# Patient Record
Sex: Female | Born: 1980 | Race: White | Hispanic: No | Marital: Married | Smoking: Never smoker
Health system: Southern US, Community
[De-identification: ages and names within clinical notes are randomized; demographics above are authoritative.]

## PROBLEM LIST (undated history)

## (undated) DIAGNOSIS — F329 Major depressive disorder, single episode, unspecified: Secondary | ICD-10-CM

## (undated) DIAGNOSIS — F32A Depression, unspecified: Secondary | ICD-10-CM

## (undated) DIAGNOSIS — E079 Disorder of thyroid, unspecified: Secondary | ICD-10-CM

## (undated) DIAGNOSIS — E282 Polycystic ovarian syndrome: Secondary | ICD-10-CM

## (undated) HISTORY — PX: ABDOMINAL HYSTERECTOMY: SHX81

## (undated) HISTORY — PX: OTHER SURGICAL HISTORY: SHX169

## (undated) HISTORY — PX: CHOLECYSTECTOMY: SHX55

---

## 2015-05-07 ENCOUNTER — Encounter (HOSPITAL_COMMUNITY): Payer: Self-pay

## 2015-05-07 ENCOUNTER — Emergency Department (HOSPITAL_COMMUNITY): Payer: BC Managed Care – PPO

## 2015-05-07 ENCOUNTER — Emergency Department (HOSPITAL_COMMUNITY)
Admission: EM | Admit: 2015-05-07 | Discharge: 2015-05-07 | Disposition: A | Discharge status: Home / Self Care | Payer: BC Managed Care – PPO | Attending: Emergency Medicine | Admitting: Emergency Medicine

## 2015-05-07 DIAGNOSIS — Y9389 Activity, other specified: Secondary | ICD-10-CM | POA: Insufficient documentation

## 2015-05-07 DIAGNOSIS — S161XXA Strain of muscle, fascia and tendon at neck level, initial encounter: Secondary | ICD-10-CM

## 2015-05-07 DIAGNOSIS — Y998 Other external cause status: Secondary | ICD-10-CM | POA: Diagnosis not present

## 2015-05-07 DIAGNOSIS — E079 Disorder of thyroid, unspecified: Secondary | ICD-10-CM | POA: Insufficient documentation

## 2015-05-07 DIAGNOSIS — S199XXA Unspecified injury of neck, initial encounter: Secondary | ICD-10-CM | POA: Diagnosis present

## 2015-05-07 DIAGNOSIS — Y92410 Unspecified street and highway as the place of occurrence of the external cause: Secondary | ICD-10-CM | POA: Insufficient documentation

## 2015-05-07 DIAGNOSIS — F329 Major depressive disorder, single episode, unspecified: Secondary | ICD-10-CM | POA: Insufficient documentation

## 2015-05-07 DIAGNOSIS — S3992XA Unspecified injury of lower back, initial encounter: Secondary | ICD-10-CM | POA: Diagnosis not present

## 2015-05-07 HISTORY — DX: Disorder of thyroid, unspecified: E07.9

## 2015-05-07 HISTORY — DX: Polycystic ovarian syndrome: E28.2

## 2015-05-07 HISTORY — DX: Major depressive disorder, single episode, unspecified: F32.9

## 2015-05-07 HISTORY — DX: Depression, unspecified: F32.A

## 2015-05-07 MED ORDER — IBUPROFEN 800 MG PO TABS: 800.0000 mg | ORAL_TABLET | Freq: Three times a day (TID) | ORAL | Status: AC

## 2015-05-07 MED ORDER — LORAZEPAM 1 MG PO TABS
0.5000 mg | ORAL_TABLET | Freq: Once | ORAL | Status: AC
  Administered 2015-05-07: 0.5 mg via ORAL
  Filled 2015-05-07: qty 1

## 2015-05-07 MED ORDER — KETOROLAC TROMETHAMINE 60 MG/2ML IM SOLN
30.0000 mg | Freq: Once | INTRAMUSCULAR | Status: AC
  Administered 2015-05-07: 30 mg via INTRAMUSCULAR
  Filled 2015-05-07: qty 2

## 2015-05-07 MED ORDER — DIAZEPAM 5 MG PO TABS: 5.0000 mg | ORAL_TABLET | Freq: Three times a day (TID) | ORAL | Status: AC | PRN
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
## 2015-05-07 NOTE — ED Notes (Signed)
Patient reports pain to right shoulder, neck, and chest.

## 2015-05-07 NOTE — ED Notes (Signed)
Patient transported to X-ray 

## 2015-05-07 NOTE — ED Notes (Signed)
PA at bedside.
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
## 2016-08-25 ENCOUNTER — Encounter (HOSPITAL_BASED_OUTPATIENT_CLINIC_OR_DEPARTMENT_OTHER): Payer: Self-pay | Admitting: Emergency Medicine

## 2016-08-25 ENCOUNTER — Emergency Department (HOSPITAL_BASED_OUTPATIENT_CLINIC_OR_DEPARTMENT_OTHER): Payer: BC Managed Care – PPO

## 2016-08-25 ENCOUNTER — Emergency Department (HOSPITAL_BASED_OUTPATIENT_CLINIC_OR_DEPARTMENT_OTHER)
Admission: EM | Admit: 2016-08-25 | Discharge: 2016-08-25 | Disposition: A | Discharge status: Home / Self Care | Payer: BC Managed Care – PPO | Attending: Emergency Medicine | Admitting: Emergency Medicine

## 2016-08-25 DIAGNOSIS — R222 Localized swelling, mass and lump, trunk: Secondary | ICD-10-CM | POA: Diagnosis not present

## 2016-08-25 DIAGNOSIS — R1900 Intra-abdominal and pelvic swelling, mass and lump, unspecified site: Secondary | ICD-10-CM | POA: Diagnosis present

## 2016-08-25 DIAGNOSIS — E039 Hypothyroidism, unspecified: Secondary | ICD-10-CM | POA: Diagnosis not present

## 2016-08-25 LAB — CBC WITH DIFFERENTIAL/PLATELET
Basophils Absolute: 0 10*3/uL (ref 0.0–0.1)
Basophils Relative: 0 %
EOS PCT: 1 %
Eosinophils Absolute: 0 10*3/uL (ref 0.0–0.7)
HEMATOCRIT: 41.4 % (ref 36.0–46.0)
Hemoglobin: 14.9 g/dL (ref 12.0–15.0)
LYMPHS ABS: 2.6 10*3/uL (ref 0.7–4.0)
LYMPHS PCT: 40 %
MCH: 31.4 pg (ref 26.0–34.0)
MCHC: 36 g/dL (ref 30.0–36.0)
MCV: 87.2 fL (ref 78.0–100.0)
MONO ABS: 0.5 10*3/uL (ref 0.1–1.0)
Monocytes Relative: 8 %
NEUTROS ABS: 3.4 10*3/uL (ref 1.7–7.7)
Neutrophils Relative %: 51 %
PLATELETS: 189 10*3/uL (ref 150–400)
RBC: 4.75 MIL/uL (ref 3.87–5.11)
RDW: 12.3 % (ref 11.5–15.5)
WBC: 6.5 10*3/uL (ref 4.0–10.5)

## 2016-08-25 LAB — BASIC METABOLIC PANEL
Anion gap: 9 (ref 5–15)
BUN: 14 mg/dL (ref 6–20)
CHLORIDE: 105 mmol/L (ref 101–111)
CO2: 24 mmol/L (ref 22–32)
Calcium: 9 mg/dL (ref 8.9–10.3)
Creatinine, Ser: 0.82 mg/dL (ref 0.44–1.00)
GFR calc Af Amer: >60 mL/min (ref >60)
GFR calc non Af Amer: >60 mL/min (ref >60)
GLUCOSE: 92 mg/dL (ref 65–99)
POTASSIUM: 3.9 mmol/L (ref 3.5–5.1)
Sodium: 138 mmol/L (ref 135–145)

## 2016-08-25 MED ORDER — IOPAMIDOL (ISOVUE-300) INJECTION 61%
100.0000 mL | Freq: Once | INTRAVENOUS | Status: AC | PRN
  Administered 2016-08-25: 100 mL via INTRAVENOUS

## 2016-08-25 NOTE — ED Triage Notes (Signed)
Pt states she has a knott on the inside of her right side, sent over from urgent care

## 2016-08-25 NOTE — ED Provider Notes (Signed)
[facility] Provider Note   CSN: [id number] Arrival date & time: 08/25/16  1645  By signing my name below, I, Doreatha Martin, attest that this documentation has been prepared under the direction and in the presence of Rolan Bucco, MD. Electronically Signed: Doreatha Martin, ED Scribe. 08/25/16. 6:17 PM.   History   Chief Complaint Chief Complaint  Patient presents with  . Abdominal Pain    HPI Alicia Lewis is a 35 y.o. female with h/o cholecystectomy who presents to the Emergency Department complaining of an unchanged, intermittently painful area of swelling on her right lateral abdomen onset a week ago. Pt describes her pain as burning and reports she feels pain with palpation or when lying on her right side. Pt notes she had a few loose bowel movements today, but states that they have otherwise been normal with her current symptoms. No recent illnesses. She denies rash, nausea, emesis, fever.  She was seen at Urgent care and sent her to get a CT scan.  The history is provided by the patient. No language interpreter was used.    Past Medical History:  Diagnosis Date  . Depression   . Polycystic disease, ovaries   . Thyroid disease    Hypothyroidism    There are no active problems to display for this patient.   Past Surgical History:  Procedure Laterality Date  . ABDOMINAL HYSTERECTOMY    . CESAREAN SECTION    . CHOLECYSTECTOMY    . KNee Surgery      OB History    No data available       Home Medications    Prior to Admission medications   Medication Sig Start Date End Date Taking? Authorizing Provider  ALPRAZolam Prudy Feeler) 0.25 MG tablet Take 0.25 mg by mouth daily as needed for anxiety.  03/17/15   Historical Provider, MD  buPROPion (WELLBUTRIN) 75 MG tablet Take 75 mg by mouth daily. 04/18/15   Historical Provider, MD  diazepam (VALIUM) 5 MG tablet Take 1 tablet (5 mg total) by mouth every 8 (eight) hours as needed for muscle spasms. 05/07/15   Roxy Horseman, PA-C  estradiol (ESTRACE) 0.5 MG tablet Take 0.5 mg by mouth daily as needed (yeast infections).  02/26/15   Historical Provider, MD  fexofenadine (ALLEGRA) 180 MG tablet Take 180 mg by mouth daily as needed for allergies or rhinitis.    Historical Provider, MD  ibuprofen (ADVIL,MOTRIN) 800 MG tablet Take 1 tablet (800 mg total) by mouth 3 (three) times daily. 05/07/15   Roxy Horseman, PA-C  LEVOXYL 50 MCG tablet Take 50 mcg by mouth at bedtime. 04/18/15   Historical Provider, MD    Family History History reviewed. No pertinent family history.  Social History Social History  Substance Use Topics  . Smoking status: Never Smoker  . Smokeless tobacco: Never Used  . Alcohol use No     Allergies   Demerol [meperidine]; Vicodin [hydrocodone-acetaminophen]; Latex; and Sulfa antibiotics   Review of Systems Review of Systems  Constitutional: Negative for chills, diaphoresis, fatigue and fever.  HENT: Negative for congestion, rhinorrhea and sneezing.   Eyes: Negative.   Respiratory: Negative for cough, chest tightness and shortness of breath.   Cardiovascular: Negative for chest pain and leg swelling.  Gastrointestinal: Positive for abdominal pain. Negative for blood in stool, diarrhea, nausea and vomiting.  Genitourinary: Negative for difficulty urinating, flank pain, frequency and hematuria.  Musculoskeletal: Negative for arthralgias and back pain.  Skin: Negative for rash.  Neurological: Negative  for dizziness, speech difficulty, weakness, numbness and headaches.     Physical Exam Updated Vital Signs BP 153/90 (BP Location: Left Arm)   Pulse 88   Temp 98.6 F (37 C) (Oral)   Resp 18   Ht 5' 6.5" (1.689 m)   Wt 202 lb (91.6 kg)   SpO2 100%   BMI 32.12 kg/m   Physical Exam  Constitutional: She is oriented to person, place, and time. She appears well-developed and well-nourished.  HENT:  Head: Normocephalic and atraumatic.  Eyes: Pupils are equal, round, and  reactive to light.  Neck: Normal range of motion. Neck supple.  Cardiovascular: Normal rate, regular rhythm and normal heart sounds.   Pulmonary/Chest: Effort normal and breath sounds normal. No respiratory distress. She has no wheezes. She has no rales. She exhibits no tenderness.  Abdominal: Soft. Bowel sounds are normal. There is tenderness. There is no rebound and no guarding.  Linear palpable mass to the right mid abdomen about 2 cm x 5 cm. TTP. No surrounding tenderness. No overlying rash.   Musculoskeletal: Normal range of motion. She exhibits no edema.  Lymphadenopathy:    She has no cervical adenopathy.  Neurological: She is alert and oriented to person, place, and time.  Skin: Skin is warm and dry. No rash noted.  Psychiatric: She has a normal mood and affect.  Nursing note and vitals reviewed.    ED Treatments / Results   DIAGNOSTIC STUDIES: Oxygen Saturation is 100% on RA, normal by my interpretation.    COORDINATION OF CARE: 6:13 PM Discussed treatment plan with pt at bedside which includes CT A/P and pt agreed to plan.   Labs (all labs ordered are listed, but only abnormal results are displayed) Labs Reviewed  BASIC METABOLIC PANEL  CBC WITH DIFFERENTIAL/PLATELET    EKG  EKG Interpretation None       Radiology Ct Abdomen Pelvis W Contrast  Result Date: 08/25/2016 CLINICAL DATA:  Patient has a knot on right lateral abdominal area for about 1 week with burning sensation. Prior cholecystectomy. EXAM: CT ABDOMEN AND PELVIS WITH CONTRAST TECHNIQUE: Multidetector CT imaging of the abdomen and pelvis was performed using the standard protocol following bolus administration of intravenous contrast. CONTRAST:  ISOVUE-300 IOPAMIDOL (ISOVUE-300) INJECTION 61% COMPARISON:  None. FINDINGS: Radio-opaque marker has been placed on the skin at the site of patient concern. Lower chest: Unremarkable. Hepatobiliary: No focal abnormality within the liver parenchyma. Gallbladder  surgically absent. No intrahepatic or extrahepatic biliary dilation. Pancreas: No focal mass lesion. No dilatation of the main duct. No intraparenchymal cyst. No peripancreatic edema. Spleen: No splenomegaly. No focal mass lesion. Adrenals/Urinary Tract: No adrenal nodule or mass. Kidneys are unremarkable. No evidence for hydroureter. The urinary bladder appears normal for the degree of distention. Stomach/Bowel: Stomach is nondistended. No gastric wall thickening. No evidence of outlet obstruction. Duodenum is normally positioned as is the ligament of Treitz. No small bowel wall thickening. No small bowel dilatation. The terminal ileum is normal. The appendix is not visualized, but there is no edema or inflammation in the region of the cecum. No gross colonic mass. No colonic wall thickening. No substantial diverticular change. Vascular/Lymphatic: No abdominal aortic aneurysm. No abdominal aortic atherosclerotic calcification. There is no gastrohepatic or hepatoduodenal ligament lymphadenopathy. No intraperitoneal or retroperitoneal lymphadenopathy. No pelvic sidewall lymphadenopathy. Reproductive: Uterus surgically absent.  There is no adnexal mass. Other: No intraperitoneal free fluid. Musculoskeletal: Sclerotic focus posterior right acetabulum likely a bone island given patient age and lack of  primary cancer history. Tiny sclerotic focus posterior left iliac bone also likely bone island. Evaluating the right antral lateral abdominal wall in the region of the localization marker demonstrates no underlying mass lesion. No evidence for skin thickening or subcutaneous edema/inflammation. No anterior abdominal wall lipoma. No evidence for hernia in this region or elsewhere in the abdomen. IMPRESSION: 1. No abnormality identified in the right antero lateral abdominal wall as localized by the patient and placement of a radiopaque marker on the skin. 2. No acute findings in the abdomen or pelvis. Electronically Signed    By: Kennith CenterEric  Mansell M.D.   On: 08/25/2016 19:54    Procedures Procedures (including critical care time)  Medications Ordered in ED Medications  iopamidol (ISOVUE-300) 61 % injection 100 mL (100 mLs Intravenous Contrast Given 08/25/16 1923)     Initial Impression / Assessment and Plan / ED Course  I have reviewed the triage vital signs and the nursing notes.  Pertinent labs & imaging results that were available during my care of the patient were reviewed by me and considered in my medical decision making (see chart for details).  Clinical Course    There is no abnormal mass or other findings noted on the CT scan. No evidence of abscess. Clinically it does not look like a cellulitis or infection. Patient was advised to use warm compresses to the area. She was advised to use ibuprofen or Tylenol for symptomatic relief and to follow-up with her PCP for recheck.  Final Clinical Impressions(s) / ED Diagnoses   Final diagnoses:  Abdominal wall mass    New Prescriptions New Prescriptions   No medications on file    I personally performed the services described in this documentation, which was scribed in my presence.  The recorded information has been reviewed and considered.    Rolan BuccoMelanie Iasia Forcier, MD 08/25/16 2011

## 2016-08-25 NOTE — Discharge Instructions (Signed)
Use warm compresses to area.  Use Tylenol or ibuprofen for symptomatic relief. Follow-up with your PCP if her symptoms are not improving. Return here as needed for any worsening symptoms.

## 2017-02-12 IMAGING — CT CT ABD-PELV W/ CM
2 of 4 series · 15 of 46 positions shown, 17 images · IV contrast (APPLIED)
Comparison: None.

CLINICAL DATA: Patient has a knot on right lateral abdominal area
for about 1 week with burning sensation. Prior cholecystectomy.

EXAM:
CT ABDOMEN AND PELVIS WITH CONTRAST
TECHNIQUE: Multidetector CT imaging of the abdomen and pelvis was performed
using the standard protocol following bolus administration of
intravenous contrast.
CONTRAST:  100mL 3ZS911-SBB IOPAMIDOL (3ZS911-SBB) INJECTION 61%

[Series 2: axial st · axial · 0.98mm/px · z∈[-572,-132]mm · 12 of 101 slices shown, 14 images]
[im 9/101  soft-tissue]
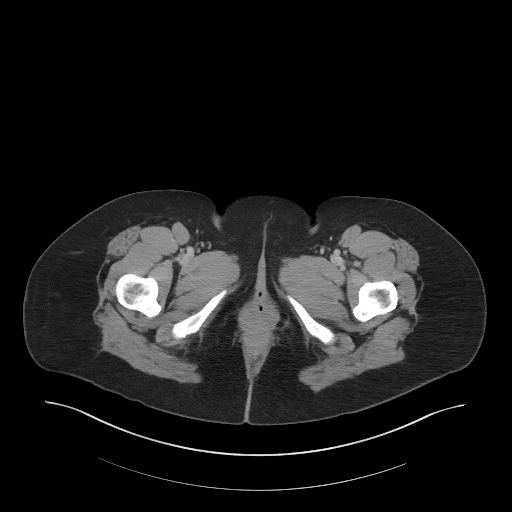
[im 9/101  bone]
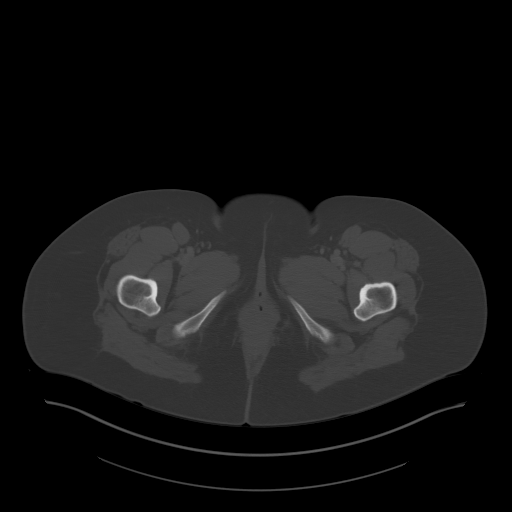
[im 17/101  soft-tissue]
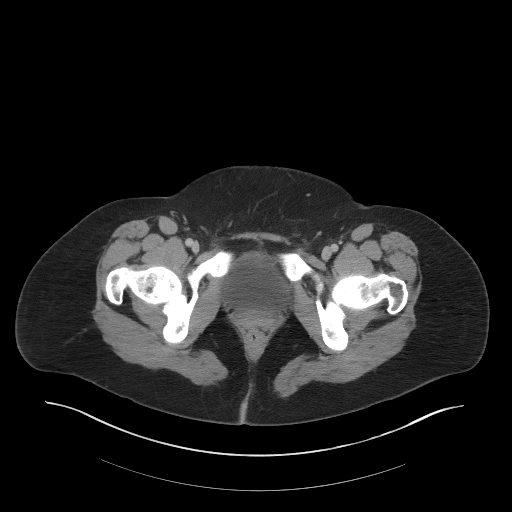
[im 25/101  soft-tissue]
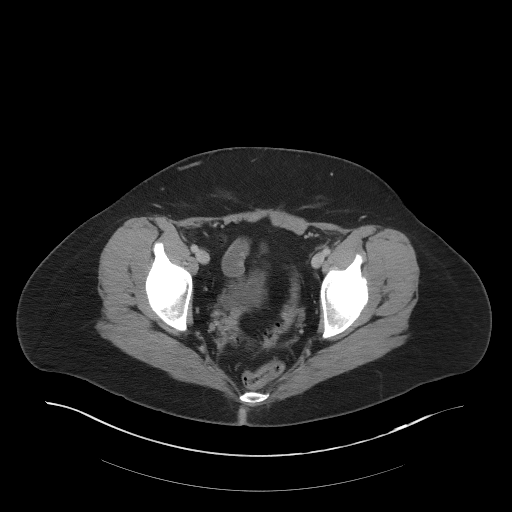
[im 33/101  soft-tissue]
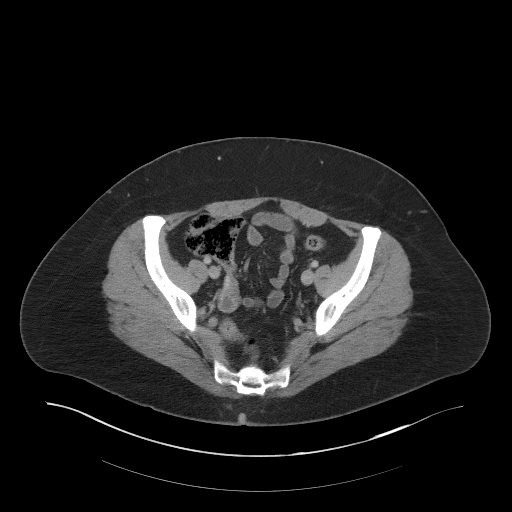
[im 41/101  soft-tissue]
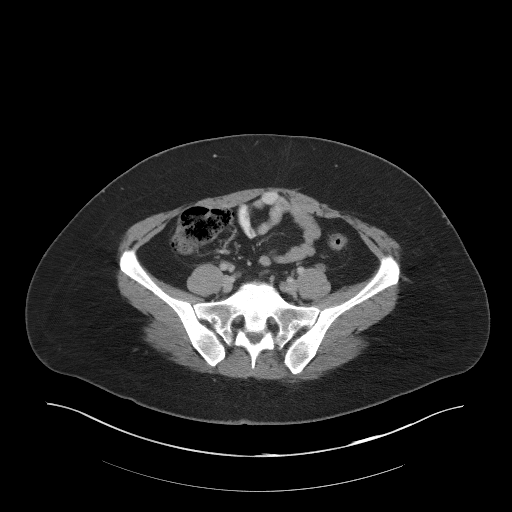
[im 49/101  soft-tissue]
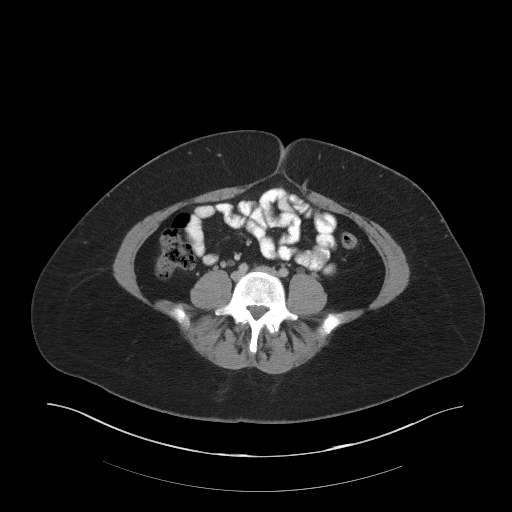
[im 57/101  soft-tissue]
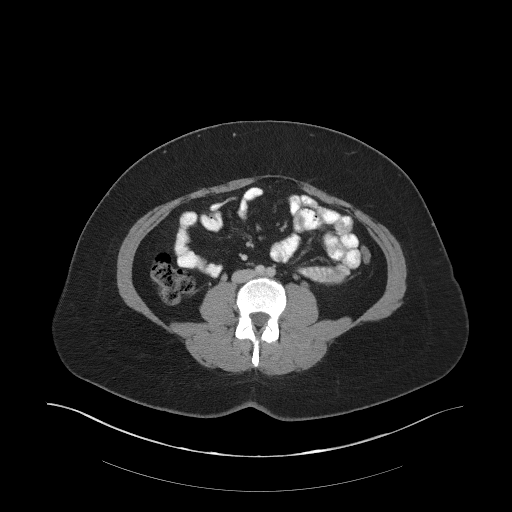
[im 65/101  soft-tissue]
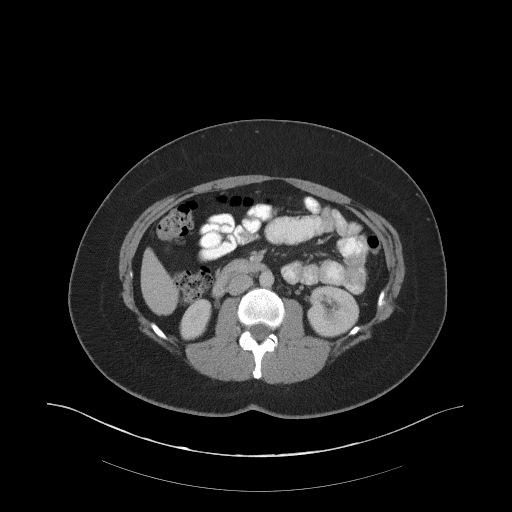
[im 73/101  soft-tissue]
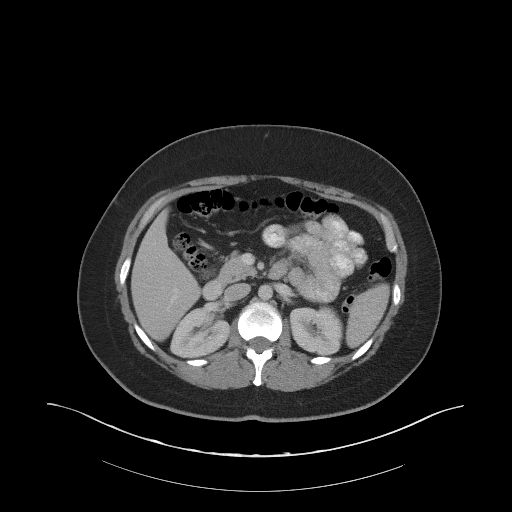
[im 73/101  bone]
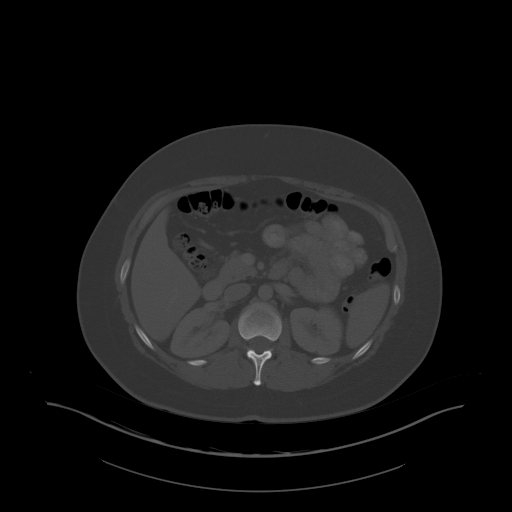
[im 81/101  soft-tissue]
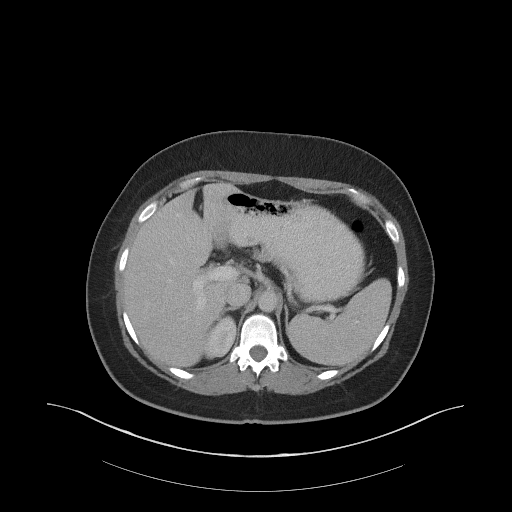
[im 89/101  soft-tissue]
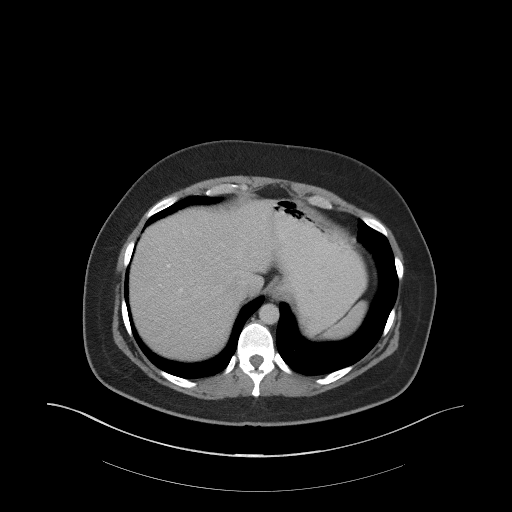
[im 97/101  soft-tissue]
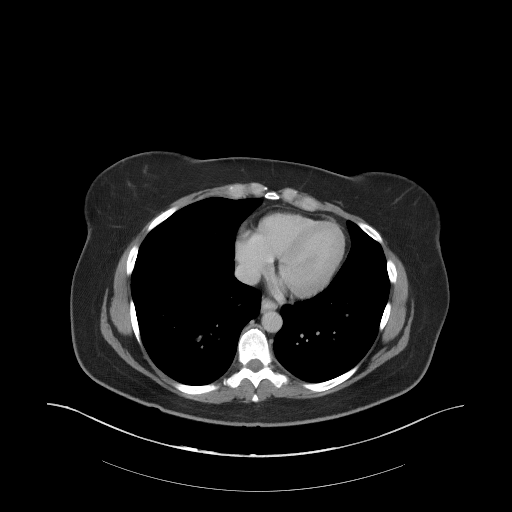

[Series 5: coronal st · coronal · 0.95mm/px · 3 of 94 slices shown]
[im 32/94  soft-tissue]
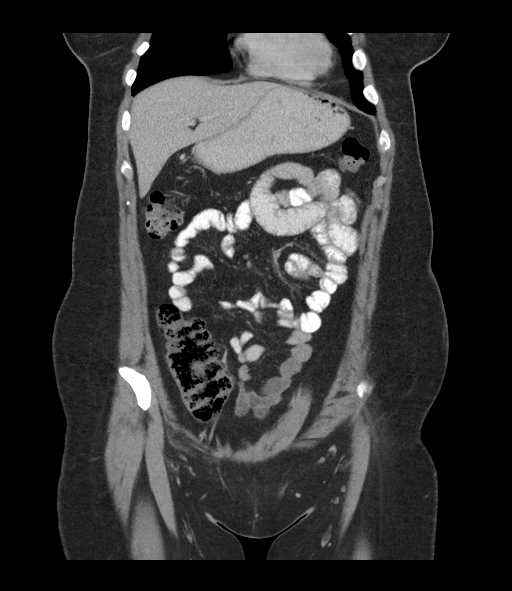
[im 42/94  soft-tissue]
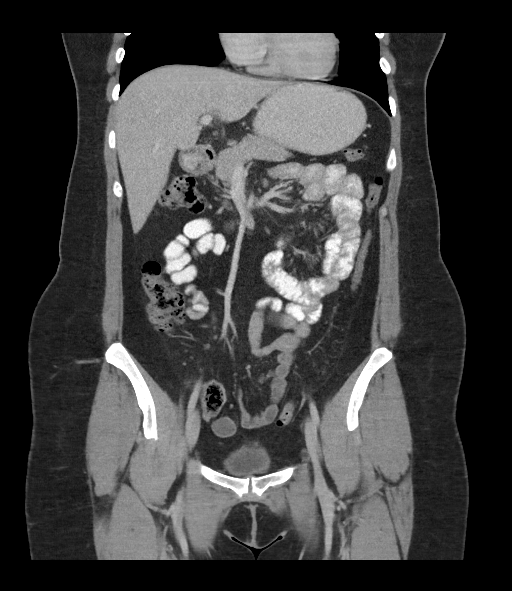
[im 52/94  soft-tissue]
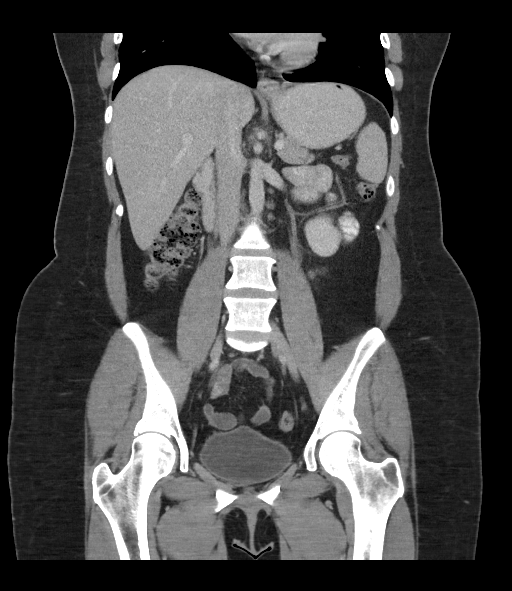

[15 of 46 positions shown; findings below may reference images not displayed]

FINDINGS: Radio-opaque marker has been placed on the skin at the site of
patient concern.

Lower chest: Unremarkable.

Hepatobiliary: No focal abnormality within the liver parenchyma.
Gallbladder surgically absent. No intrahepatic or extrahepatic
biliary dilation.

Pancreas: No focal mass lesion. No dilatation of the main duct. No
intraparenchymal cyst. No peripancreatic edema.

Spleen: No splenomegaly. No focal mass lesion.

Adrenals/Urinary Tract: No adrenal nodule or mass. Kidneys are
unremarkable. No evidence for hydroureter. The urinary bladder
appears normal for the degree of distention.

Stomach/Bowel: Stomach is nondistended. No gastric wall thickening.
No evidence of outlet obstruction. Duodenum is normally positioned
as is the ligament of Treitz. No small bowel wall thickening. No
small bowel dilatation. The terminal ileum is normal. The appendix
is not visualized, but there is no edema or inflammation in the
region of the cecum. No gross colonic mass. No colonic wall
thickening. No substantial diverticular change.

Vascular/Lymphatic: No abdominal aortic aneurysm. No abdominal
aortic atherosclerotic calcification. There is no gastrohepatic or
hepatoduodenal ligament lymphadenopathy. No intraperitoneal or
retroperitoneal lymphadenopathy. No pelvic sidewall lymphadenopathy.

Reproductive: Uterus surgically absent.  There is no adnexal mass.

Other: No intraperitoneal free fluid.

Musculoskeletal: Sclerotic focus posterior right acetabulum likely a
bone island given patient age and lack of primary cancer history.
Tiny sclerotic focus posterior left iliac bone also likely bone
island.

Evaluating the right antral lateral abdominal wall in the region of
the localization marker demonstrates no underlying mass lesion. No
evidence for skin thickening or subcutaneous edema/inflammation. No
anterior abdominal wall lipoma. No evidence for hernia in this
region or elsewhere in the abdomen.
IMPRESSION: 1. No abnormality identified in the right antero lateral abdominal
wall as localized by the patient and placement of a radiopaque
marker on the skin.
2. No acute findings in the abdomen or pelvis.
